# Patient Record
Sex: Male | Born: 1975 | Hispanic: Yes | Marital: Single | Smoking: Never smoker
Health system: Southern US, Community
[De-identification: ages and names within clinical notes are randomized; demographics above are authoritative.]

## PROBLEM LIST (undated history)

## (undated) DIAGNOSIS — E119 Type 2 diabetes mellitus without complications: Secondary | ICD-10-CM

## (undated) HISTORY — PX: NO PAST SURGERIES: SHX2092

---

## 2011-12-01 ENCOUNTER — Emergency Department: Payer: Self-pay | Admitting: Unknown Physician Specialty

## 2013-06-01 ENCOUNTER — Emergency Department: Payer: Self-pay | Admitting: Emergency Medicine

## 2013-06-01 LAB — COMPREHENSIVE METABOLIC PANEL
ANION GAP: 7 (ref 7–16)
AST: 38 U/L — AB (ref 15–37)
Albumin: 3.9 g/dL (ref 3.4–5.0)
Alkaline Phosphatase: 85 U/L
BUN: 19 mg/dL — ABNORMAL HIGH (ref 7–18)
Bilirubin,Total: 0.4 mg/dL (ref 0.2–1.0)
CALCIUM: 9 mg/dL (ref 8.5–10.1)
Chloride: 102 mmol/L (ref 98–107)
Co2: 26 mmol/L (ref 21–32)
Creatinine: 0.83 mg/dL (ref 0.60–1.30)
EGFR (Non-African Amer.): >60
GLUCOSE: 111 mg/dL — AB (ref 65–99)
OSMOLALITY: 273 (ref 275–301)
Potassium: 3.6 mmol/L (ref 3.5–5.1)
SGPT (ALT): 57 U/L (ref 12–78)
SODIUM: 135 mmol/L — AB (ref 136–145)
Total Protein: 7.7 g/dL (ref 6.4–8.2)

## 2013-06-01 LAB — URINALYSIS, COMPLETE
BACTERIA: NONE SEEN
Bilirubin,UR: NEGATIVE
Blood: NEGATIVE
GLUCOSE, UR: NEGATIVE mg/dL (ref 0–75)
Ketone: NEGATIVE
LEUKOCYTE ESTERASE: NEGATIVE
Nitrite: NEGATIVE
Ph: 6 (ref 4.5–8.0)
Protein: NEGATIVE
RBC,UR: NONE SEEN /HPF (ref 0–5)
SPECIFIC GRAVITY: 1.011 (ref 1.003–1.030)
Squamous Epithelial: NONE SEEN
WBC UR: NONE SEEN /HPF (ref 0–5)

## 2013-06-01 LAB — TROPONIN I
TROPONIN-I: 0.04 ng/mL
TROPONIN-I: 0.04 ng/mL

## 2013-06-01 LAB — CBC WITH DIFFERENTIAL/PLATELET
BASOS ABS: 0 10*3/uL (ref 0.0–0.1)
BASOS PCT: 0.5 %
EOS PCT: 2.5 %
Eosinophil #: 0.2 10*3/uL (ref 0.0–0.7)
HCT: 47 % (ref 40.0–52.0)
HGB: 15.9 g/dL (ref 13.0–18.0)
LYMPHS ABS: 4.1 10*3/uL — AB (ref 1.0–3.6)
Lymphocyte %: 43.4 %
MCH: 30 pg (ref 26.0–34.0)
MCHC: 33.8 g/dL (ref 32.0–36.0)
MCV: 89 fL (ref 80–100)
MONO ABS: 0.5 x10 3/mm (ref 0.2–1.0)
Monocyte %: 5.8 %
NEUTROS ABS: 4.5 10*3/uL (ref 1.4–6.5)
NEUTROS PCT: 47.8 %
Platelet: 207 10*3/uL (ref 150–440)
RBC: 5.29 10*6/uL (ref 4.40–5.90)
RDW: 13.7 % (ref 11.5–14.5)
WBC: 9.4 10*3/uL (ref 3.8–10.6)
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
## 2016-11-15 ENCOUNTER — Emergency Department: Payer: Self-pay

## 2016-11-15 ENCOUNTER — Encounter: Payer: Self-pay | Admitting: Emergency Medicine

## 2016-11-15 ENCOUNTER — Emergency Department
Admission: EM | Admit: 2016-11-15 | Discharge: 2016-11-16 | Disposition: A | Discharge status: Home / Self Care | Payer: Self-pay | Attending: Emergency Medicine | Admitting: Emergency Medicine

## 2016-11-15 DIAGNOSIS — S0990XA Unspecified injury of head, initial encounter: Secondary | ICD-10-CM | POA: Insufficient documentation

## 2016-11-15 DIAGNOSIS — Y999 Unspecified external cause status: Secondary | ICD-10-CM | POA: Insufficient documentation

## 2016-11-15 DIAGNOSIS — Y939 Activity, unspecified: Secondary | ICD-10-CM | POA: Insufficient documentation

## 2016-11-15 DIAGNOSIS — E119 Type 2 diabetes mellitus without complications: Secondary | ICD-10-CM | POA: Insufficient documentation

## 2016-11-15 DIAGNOSIS — S022XXA Fracture of nasal bones, initial encounter for closed fracture: Secondary | ICD-10-CM | POA: Insufficient documentation

## 2016-11-15 DIAGNOSIS — Y929 Unspecified place or not applicable: Secondary | ICD-10-CM | POA: Insufficient documentation

## 2016-11-15 HISTORY — DX: Type 2 diabetes mellitus without complications: E11.9
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
## 2016-11-16 MED ORDER — AMOXICILLIN-POT CLAVULANATE 875-125 MG PO TABS: 1.0000 | ORAL_TABLET | Freq: Two times a day (BID) | ORAL | 0 refills | Status: AC

## 2016-11-16 MED ORDER — OXYCODONE-ACETAMINOPHEN 5-325 MG PO TABS
2.0000 | ORAL_TABLET | Freq: Once | ORAL | Status: AC
  Administered 2016-11-16: 2 via ORAL
  Filled 2016-11-16: qty 2

## 2016-11-16 MED ORDER — AMOXICILLIN-POT CLAVULANATE 875-125 MG PO TABS
1.0000 | ORAL_TABLET | Freq: Once | ORAL | Status: AC
  Administered 2016-11-16: 1 via ORAL
  Filled 2016-11-16: qty 1

## 2016-11-16 MED ORDER — DOCUSATE SODIUM 100 MG PO CAPS: ORAL_CAPSULE | ORAL | 0 refills | Status: AC

## 2016-11-16 MED ORDER — HYDROCODONE-ACETAMINOPHEN 5-325 MG PO TABS: 1.0000 | ORAL_TABLET | Freq: Four times a day (QID) | ORAL | 0 refills | Status: AC | PRN
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
## 2016-11-16 NOTE — ED Notes (Signed)
Pt signed paper copy of discharge 

## 2016-11-24 ENCOUNTER — Encounter: Payer: Self-pay | Admitting: *Deleted

## 2016-11-24 NOTE — Discharge Instructions (Signed)
General Anesthesia, Adult, Care After °These instructions provide you with information about caring for yourself after your procedure. Your health care provider may also give you more specific instructions. Your treatment has been planned according to current medical practices, but problems sometimes occur. Call your health care provider if you have any problems or questions after your procedure. °What can I expect after the procedure? °After the procedure, it is common to have: °· Vomiting. °· A sore throat. °· Mental slowness. ° °It is common to feel: °· Nauseous. °· Cold or shivery. °· Sleepy. °· Tired. °· Sore or achy, even in parts of your body where you did not have surgery. ° °Follow these instructions at home: °For at least 24 hours after the procedure: °· Do not: °? Participate in activities where you could fall or become injured. °? Drive. °? Use heavy machinery. °? Drink alcohol. °? Take sleeping pills or medicines that cause drowsiness. °? Make important decisions or sign legal documents. °? Take care of children on your own. °· Rest. °Eating and drinking °· If you vomit, drink water, juice, or soup when you can drink without vomiting. °· Drink enough fluid to keep your urine clear or pale yellow. °· Make sure you have little or no nausea before eating solid foods. °· Follow the diet recommended by your health care provider. °General instructions °· Have a responsible adult stay with you until you are awake and alert. °· Return to your normal activities as told by your health care provider. Ask your health care provider what activities are safe for you. °· Take over-the-counter and prescription medicines only as told by your health care provider. °· If you smoke, do not smoke without supervision. °· Keep all follow-up visits as told by your health care provider. This is important. °Contact a health care provider if: °· You continue to have nausea or vomiting at home, and medicines are not helpful. °· You  cannot drink fluids or start eating again. °· You cannot urinate after 8-12 hours. °· You develop a skin rash. °· You have fever. °· You have increasing redness at the site of your procedure. °Get help right away if: °· You have difficulty breathing. °· You have chest pain. °· You have unexpected bleeding. °· You feel that you are having a life-threatening or urgent problem. °This information is not intended to replace advice given to you by your health care provider. Make sure you discuss any questions you have with your health care provider. °Document Released: 05/12/2000 Document Revised: 07/09/2015 Document Reviewed: 01/18/2015 °Elsevier Interactive Patient Education © 2018 [employer] ° °

## 2016-11-27 ENCOUNTER — Ambulatory Visit: Payer: Self-pay | Admitting: Anesthesiology

## 2016-11-27 ENCOUNTER — Encounter
Admission: RE | Disposition: A | Discharge status: Home / Self Care | Payer: Self-pay | Source: Ambulatory Visit | Attending: Otolaryngology

## 2016-11-27 ENCOUNTER — Ambulatory Visit
Admission: RE | Admit: 2016-11-27 | Discharge: 2016-11-27 | Disposition: A | Discharge status: Home / Self Care | Payer: Self-pay | Source: Ambulatory Visit | Attending: Otolaryngology | Admitting: Otolaryngology

## 2016-11-27 DIAGNOSIS — E119 Type 2 diabetes mellitus without complications: Secondary | ICD-10-CM | POA: Insufficient documentation

## 2016-11-27 DIAGNOSIS — Z7984 Long term (current) use of oral hypoglycemic drugs: Secondary | ICD-10-CM | POA: Insufficient documentation

## 2016-11-27 DIAGNOSIS — Y929 Unspecified place or not applicable: Secondary | ICD-10-CM | POA: Insufficient documentation

## 2016-11-27 DIAGNOSIS — Z79899 Other long term (current) drug therapy: Secondary | ICD-10-CM | POA: Insufficient documentation

## 2016-11-27 DIAGNOSIS — S022XXA Fracture of nasal bones, initial encounter for closed fracture: Secondary | ICD-10-CM | POA: Insufficient documentation

## 2016-11-27 HISTORY — PX: CLOSED REDUCTION NASAL FRACTURE: SHX5365

## 2016-11-27 LAB — GLUCOSE, CAPILLARY
Glucose-Capillary: 173 mg/dL — ABNORMAL HIGH (ref 65–99)
Glucose-Capillary: 234 mg/dL — ABNORMAL HIGH (ref 65–99)

## 2016-11-27 SURGERY — CLOSED REDUCTION, FRACTURE, NASAL BONE
Anesthesia: General | Site: Nose | Wound class: Clean Contaminated

## 2016-11-27 MED ORDER — FENTANYL CITRATE (PF) 100 MCG/2ML IJ SOLN
INTRAMUSCULAR | Status: DC | PRN
  Administered 2016-11-27: 100 ug via INTRAVENOUS

## 2016-11-27 MED ORDER — MEPERIDINE HCL 25 MG/ML IJ SOLN: 6.2500 mg | INTRAMUSCULAR | Status: DC | PRN

## 2016-11-27 MED ORDER — DEXAMETHASONE SODIUM PHOSPHATE 4 MG/ML IJ SOLN
INTRAMUSCULAR | Status: DC | PRN
  Administered 2016-11-27: 8 mg via INTRAVENOUS

## 2016-11-27 MED ORDER — MIDAZOLAM HCL 5 MG/5ML IJ SOLN
INTRAMUSCULAR | Status: DC | PRN
  Administered 2016-11-27: 2 mg via INTRAVENOUS

## 2016-11-27 MED ORDER — ONDANSETRON HCL 4 MG/2ML IJ SOLN
INTRAMUSCULAR | Status: DC | PRN
  Administered 2016-11-27: 4 mg via INTRAVENOUS

## 2016-11-27 MED ORDER — LIDOCAINE HCL (CARDIAC) 20 MG/ML IV SOLN
INTRAVENOUS | Status: DC | PRN
  Administered 2016-11-27: 50 mg via INTRATRACHEAL

## 2016-11-27 MED ORDER — PROMETHAZINE HCL 25 MG/ML IJ SOLN: 6.2500 mg | INTRAMUSCULAR | Status: DC | PRN

## 2016-11-27 MED ORDER — LACTATED RINGERS IV SOLN
10.0000 mL/h | INTRAVENOUS | Status: DC
  Administered 2016-11-27: 10 mL/h via INTRAVENOUS

## 2016-11-27 MED ORDER — OXYCODONE HCL 5 MG PO TABS
5.0000 mg | ORAL_TABLET | Freq: Once | ORAL | Status: AC | PRN
  Administered 2016-11-27: 5 mg via ORAL

## 2016-11-27 MED ORDER — OXYCODONE HCL 5 MG/5ML PO SOLN: 5.0000 mg | Freq: Once | ORAL | Status: AC | PRN

## 2016-11-27 MED ORDER — PROPOFOL 10 MG/ML IV BOLUS
INTRAVENOUS | Status: DC | PRN
  Administered 2016-11-27: 200 mg via INTRAVENOUS

## 2016-11-27 MED ORDER — PHENYLEPHRINE HCL 0.5 % NA SOLN
NASAL | Status: DC | PRN
  Administered 2016-11-27: 5 mL via TOPICAL

## 2016-11-27 MED ORDER — FENTANYL CITRATE (PF) 100 MCG/2ML IJ SOLN: 25.0000 ug | INTRAMUSCULAR | Status: DC | PRN

## 2016-11-27 MED ORDER — OXYMETAZOLINE HCL 0.05 % NA SOLN
2.0000 | Freq: Once | NASAL | Status: AC
  Administered 2016-11-27: 2 via NASAL

## 2016-11-27 MED ORDER — GLYCOPYRROLATE 0.2 MG/ML IJ SOLN
INTRAMUSCULAR | Status: DC | PRN
Start: 2016-11-27 — End: 2016-11-27
  Administered 2016-11-27: 0.1 mg via INTRAVENOUS

## 2016-11-27 SURGICAL SUPPLY — 18 items
ADHESIVE MASTISOL STRL (MISCELLANEOUS) IMPLANT
BASIN GRAD PLASTIC 32OZ STRL (MISCELLANEOUS) ×3 IMPLANT
CANISTER SUCT 1200ML W/VALVE (MISCELLANEOUS) ×3 IMPLANT
CLOSURE WOUND 1/2 X4 (GAUZE/BANDAGES/DRESSINGS) ×1
COVER MAYO STAND STRL (DRAPES) ×3 IMPLANT
CUP MEDICINE 2OZ PLAST GRAD ST (MISCELLANEOUS) ×3 IMPLANT
GAUZE PETROLATUM 1 X8 (GAUZE/BANDAGES/DRESSINGS) IMPLANT
GAUZE SPONGE 4X4 12PLY STRL (GAUZE/BANDAGES/DRESSINGS) ×3 IMPLANT
GLOVE PI ULTRA LF STRL 7.5 (GLOVE) ×1 IMPLANT
GLOVE PI ULTRA NON LATEX 7.5 (GLOVE) ×2
KIT ROOM TURNOVER OR (KITS) ×3 IMPLANT
PAD ALCOHOL SWAB (MISCELLANEOUS) ×3 IMPLANT
SPONGE NEURO XRAY DETECT 1X3 (DISPOSABLE) ×3 IMPLANT
STRAP BODY AND KNEE 60X3 (MISCELLANEOUS) ×3 IMPLANT
STRIP CLOSURE SKIN 1/2X4 (GAUZE/BANDAGES/DRESSINGS) ×2 IMPLANT
TOWEL OR 17X26 4PK STRL BLUE (TOWEL DISPOSABLE) ×3 IMPLANT
TUBING CONN 6MMX3.1M (TUBING) ×2
TUBING SUCTION CONN 0.25 STRL (TUBING) ×1 IMPLANT

## 2016-11-27 NOTE — Anesthesia Preprocedure Evaluation (Signed)
Anesthesia Evaluation  Patient identified by MRN, date of birth, ID band Patient awake    Reviewed: Allergy & Precautions, H&P , NPO status , Patient's Chart, lab work & pertinent test results, reviewed documented beta blocker date and time   Airway Mallampati: II  TM Distance: >3 FB Neck ROM: full    Dental no notable dental hx.    Pulmonary neg pulmonary ROS,    Pulmonary exam normal breath sounds clear to auscultation       Cardiovascular Exercise Tolerance: Good negative cardio ROS   Rhythm:regular Rate:Normal     Neuro/Psych negative neurological ROS  negative psych ROS   GI/Hepatic negative GI ROS, Neg liver ROS,   Endo/Other  diabetes, Well Controlled  Renal/GU negative Renal ROS  negative genitourinary   Musculoskeletal   Abdominal   Peds  Hematology negative hematology ROS (+)   Anesthesia Other Findings   Reproductive/Obstetrics negative OB ROS                             Anesthesia Physical Anesthesia Plan  ASA: II  Anesthesia Plan: General   Post-op Pain Management:    Induction:   PONV Risk Score and Plan:   Airway Management Planned:   Additional Equipment:   Intra-op Plan:   Post-operative Plan:   Informed Consent: I have reviewed the patients History and Physical, chart, labs and discussed the procedure including the risks, benefits and alternatives for the proposed anesthesia with the patient or authorized representative who has indicated his/her understanding and acceptance.   Dental Advisory Given  Plan Discussed with: CRNA  Anesthesia Plan Comments:         Anesthesia Quick Evaluation

## 2016-11-27 NOTE — Transfer of Care (Signed)
Immediate Anesthesia Transfer of Care Note  Patient: Thomas Choi [facility]  Procedure(s) Performed: CLOSED REDUCTION NASAL FRACTURE (N/A Nose)  Patient Location: PACU  Anesthesia Type: General  Level of Consciousness: awake, alert  and patient cooperative  Airway and Oxygen Therapy: Patient Spontanous Breathing and Patient connected to supplemental oxygen  Post-op Assessment: Post-op Vital signs reviewed, Patient's Cardiovascular Status Stable, Respiratory Function Stable, Patent Airway and No signs of Nausea or vomiting  Post-op Vital Signs: Reviewed and stable  Complications: No apparent anesthesia complications

## 2016-11-27 NOTE — Anesthesia Procedure Notes (Signed)
Procedure Name: LMA Insertion Date/Time: 11/27/2016 11:48 AM Performed by: Andee Poles Pre-anesthesia Checklist: Patient identified, Emergency Drugs available, Suction available, Timeout performed and Patient being monitored Patient Re-evaluated:Patient Re-evaluated prior to induction Oxygen Delivery Method: Circle system utilized Preoxygenation: Pre-oxygenation with 100% oxygen Induction Type: IV induction LMA: LMA inserted LMA Size: 4.0 Number of attempts: 1 Placement Confirmation: positive ETCO2 and breath sounds checked- equal and bilateral Tube secured with: Tape

## 2016-11-27 NOTE — Op Note (Signed)
11/27/2016  10:04 AM    Isabelle Course, Elita Quick  [id number]   Pre-Op Dx:  Displaced nasal fracture  Post-op Dx: Displaced nasal fracture  Proc: Closed reduction nasal fracture   Surg:  Teyona Nichelson H  Anes:  GOT  EBL:  Minimal  Comp:  None  Findings:  Nasal bones markedly pushed off to his left side. The septum was not significantly displaced and is airways were still relatively open.  Procedure: The patient was given general anesthesia by oral endotracheal intubation. Once patient was asleep his nose was prepped using cotton pledgets soaked with 4% lidocaine and Afrin. These were placed on both sides the nose Mutt sit for 5 minutes for vasoconstriction. His nose prepped with alcohol area did the cotton pledges were removed. A [place] elevator was used inside the right nostril to help elevate the right nasal bone. Digital pressure was then placed on the left nasal bones to fracture them back to the midline. Once they pop back to their normal position the nose appeared to be straight. The airways were open on both sides. Steri-Strips were placed over the nasal dorsum followed by an Aquaplast cast. This was held place with tape over the nasal dorsum. The patient was then awakened and taken to the recovery room in satisfactory condition. There were no operative complications. He tolerated the procedure very well  Dispo:   To PACU to be discharged home  Plan:  To follow-up in the office in 5 days for cast removal. He'll rest at home with his head elevated today.  Kiala Faraj H  11/27/2016 10:04 AM

## 2016-11-27 NOTE — Anesthesia Postprocedure Evaluation (Signed)
Anesthesia Post Note  Patient: Thomas Choi  Procedure(s) Performed: CLOSED REDUCTION NASAL FRACTURE (N/A Nose)  Patient location during evaluation: PACU Anesthesia Type: General Level of consciousness: awake and alert Pain management: pain level controlled Vital Signs Assessment: post-procedure vital signs reviewed and stable Respiratory status: spontaneous breathing, nonlabored ventilation, respiratory function stable and patient connected to nasal cannula oxygen Cardiovascular status: blood pressure returned to baseline and stable Postop Assessment: no apparent nausea or vomiting Anesthetic complications: no    Mariko Nowakowski ELAINE

## 2016-11-27 NOTE — H&P (Signed)
H&P has been reviewedand patient reevaluated,  and no changes necessary. To be downloaded later.  

## 2016-11-28 ENCOUNTER — Encounter: Payer: Self-pay | Admitting: Otolaryngology

## 2017-03-14 ENCOUNTER — Emergency Department
Admission: EM | Admit: 2017-03-14 | Discharge: 2017-03-15 | Disposition: A | Discharge status: Home / Self Care | Payer: Self-pay | Attending: Emergency Medicine | Admitting: Emergency Medicine

## 2017-03-14 ENCOUNTER — Encounter: Payer: Self-pay | Admitting: Emergency Medicine

## 2017-03-14 ENCOUNTER — Other Ambulatory Visit: Payer: Self-pay

## 2017-03-14 DIAGNOSIS — R300 Dysuria: Secondary | ICD-10-CM | POA: Insufficient documentation

## 2017-03-14 DIAGNOSIS — E119 Type 2 diabetes mellitus without complications: Secondary | ICD-10-CM | POA: Insufficient documentation

## 2017-03-14 DIAGNOSIS — Z79899 Other long term (current) drug therapy: Secondary | ICD-10-CM | POA: Insufficient documentation

## 2017-03-14 DIAGNOSIS — R5383 Other fatigue: Secondary | ICD-10-CM | POA: Insufficient documentation

## 2017-03-14 DIAGNOSIS — N50819 Testicular pain, unspecified: Secondary | ICD-10-CM | POA: Insufficient documentation

## 2017-03-14 LAB — COMPREHENSIVE METABOLIC PANEL
ALT: 38 U/L (ref 17–63)
ANION GAP: 11 (ref 5–15)
AST: 23 U/L (ref 15–41)
Albumin: 4.5 g/dL (ref 3.5–5.0)
Alkaline Phosphatase: 110 U/L (ref 38–126)
BUN: 17 mg/dL (ref 6–20)
CO2: 25 mmol/L (ref 22–32)
Calcium: 9.4 mg/dL (ref 8.9–10.3)
Chloride: 101 mmol/L (ref 101–111)
Creatinine, Ser: 0.74 mg/dL (ref 0.61–1.24)
GFR calc non Af Amer: >60 mL/min (ref >60)
Glucose, Bld: 315 mg/dL — ABNORMAL HIGH (ref 65–99)
POTASSIUM: 3.7 mmol/L (ref 3.5–5.1)
SODIUM: 137 mmol/L (ref 135–145)
TOTAL PROTEIN: 8 g/dL (ref 6.5–8.1)
Total Bilirubin: 1 mg/dL (ref 0.3–1.2)

## 2017-03-14 LAB — CHLAMYDIA/NGC RT PCR (ARMC ONLY)
Chlamydia Tr: NOT DETECTED
N gonorrhoeae: NOT DETECTED

## 2017-03-14 LAB — URINALYSIS, COMPLETE (UACMP) WITH MICROSCOPIC
BILIRUBIN URINE: NEGATIVE
Bacteria, UA: NONE SEEN
Glucose, UA: >=500 mg/dL — AB
HGB URINE DIPSTICK: NEGATIVE
KETONES UR: 5 mg/dL — AB
LEUKOCYTES UA: NEGATIVE
NITRITE: NEGATIVE
PROTEIN: NEGATIVE mg/dL
SPECIFIC GRAVITY, URINE: 1.026 (ref 1.005–1.030)
Squamous Epithelial / LPF: NONE SEEN
pH: 7 (ref 5.0–8.0)

## 2017-03-14 LAB — CBC
HEMATOCRIT: 45.2 % (ref 40.0–52.0)
HEMOGLOBIN: 15.6 g/dL (ref 13.0–18.0)
MCH: 30.5 pg (ref 26.0–34.0)
MCHC: 34.5 g/dL (ref 32.0–36.0)
MCV: 88.4 fL (ref 80.0–100.0)
Platelets: 234 10*3/uL (ref 150–440)
RBC: 5.12 MIL/uL (ref 4.40–5.90)
RDW: 13.3 % (ref 11.5–14.5)
WBC: 10.7 10*3/uL — ABNORMAL HIGH (ref 3.8–10.6)

## 2017-03-14 NOTE — ED Notes (Signed)
Pt questioned about anxiety. 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 Pt states he is also taking oxycodone for pain for a month as well.

## 2017-03-14 NOTE — ED Provider Notes (Signed)
[facility] Emergency Department Provider Note  ____________________________________________  Time seen: Approximately 11:14 PM  I have reviewed the triage vital signs and the nursing notes.   HISTORY  Chief Complaint Abdominal Pain  Encounter conducted with Spanish interpreter via video  HPI Thomas Choi is a 42 y.o. male who complains of left lower quadrant abdominal pain radiating to his testicle since 9 AM yesterday. No nausea vomiting or diarrhea. No fevers chills or sweats. He does endorse some dysuria but no hematuria or urinary frequency or urgency. Pain is intermittent, sharp, mild to moderate intensity. No aggravating or alleviating factors.  Patient also reports he has 4 kids age 4, 78, 38, 60. He feels like taking care of them has made him very tired and he reports his biggest concern is that he would like some rest. He has recently been taking oxycodone as well as cocaine.  Patient denies being sexually active   Past Medical History:  Diagnosis Date  . Diabetes mellitus without complication (HCC)      There are no active problems to display for this patient.    Past Surgical History:  Procedure Laterality Date  . CLOSED REDUCTION NASAL FRACTURE N/A 11/27/2016   Procedure: CLOSED REDUCTION NASAL FRACTURE;  Surgeon: Vernie Murders, MD;  Location: [facility];  Service: ENT;  Laterality: N/A;  Diabetic - oral meds  . NO PAST SURGERIES       Prior to Admission medications   Medication Sig Start Date End Date Taking? Authorizing Provider  docusate sodium (COLACE) 100 MG capsule Take 1 tablet once or twice daily as needed for constipation while taking narcotic pain medicine 11/16/16   Loleta Rose, MD  glipiZIDE (GLUCOTROL) 10 MG tablet Take 10 mg by mouth 2 (two) times daily before a meal.    [provider]  HYDROcodone-acetaminophen (NORCO/VICODIN) 5-325 MG tablet Take 1-2 tablets by mouth every 6 (six) hours as  needed for moderate pain. 11/16/16   Loleta Rose, MD  linagliptin (TRADJENTA) 5 MG TABS tablet Take 5 mg by mouth daily.    [provider]  metFORMIN (GLUCOPHAGE) 500 MG tablet Take 500 mg by mouth 2 (two) times daily with a meal.    [provider]     Allergies Patient has no known allergies.   History reviewed. No pertinent family history.  Social History Social History   Tobacco Use  . Smoking status: Never Smoker  . Smokeless tobacco: Never Used  Substance Use Topics  . Alcohol use: Yes    Comment: occasional  . Drug use: No    Review of Systems  Constitutional:   No fever or chills.  ENT:   No sore throat. No rhinorrhea. Cardiovascular:   No chest pain or syncope. Respiratory:   No dyspnea or cough. Gastrointestinal:   Positive as above for left lower quadrant abdominal pain without vomiting and diarrhea.  Musculoskeletal:   Negative for focal pain or swelling All other systems reviewed and are negative except as documented above in ROS and HPI.  ____________________________________________   PHYSICAL EXAM:  VITAL SIGNS: ED Triage Vitals  Enc Vitals Group     BP 03/14/17 1913 127/75     Pulse Rate 03/14/17 1913 81     Resp 03/14/17 1913 16     Temp 03/14/17 1913 98.7 F (37.1 C)     Temp Source 03/14/17 1913 Oral     SpO2 03/14/17 1913 97 %     Weight 03/14/17 1914 190  lb (86.2 kg)     Height 03/14/17 1914 5\' 7"  (1.702 m)     Head Circumference --      Peak Flow --      Pain Score 03/14/17 1913 5     Pain Loc --      Pain Edu? --      Excl. in GC? --     Vital signs reviewed, nursing assessments reviewed.   Constitutional:   Alert and oriented. Well appearing and in no distress. Eyes:   No scleral icterus.  EOMI. No nystagmus. No conjunctival pallor. PERRL. ENT   Head:   Normocephalic and atraumatic.   Nose:   No congestion/rhinnorhea.    Mouth/Throat:   MMM, no pharyngeal erythema. No peritonsillar mass.     Neck:   No meningismus. Full ROM. Hematological/Lymphatic/Immunilogical:   No cervical lymphadenopathy. Cardiovascular:   RRR. Symmetric bilateral radial and DP pulses.  No murmurs.  Respiratory:   Normal respiratory effort without tachypnea/retractions. Breath sounds are clear and equal bilaterally. No wheezes/rales/rhonchi. Gastrointestinal:   Soft and nontender. Non distended. There is no CVA tenderness.  No rebound, rigidity, or guarding. Genitourinary:   Normal, uncircumcised. No discharge or lesions or lymphadenopathy. Musculoskeletal:   Normal range of motion in all extremities. No joint effusions.  No lower extremity tenderness.  No edema. Neurologic:   Normal speech and language.  Motor grossly intact. No acute focal neurologic deficits are appreciated.  Skin:    Skin is warm, dry and intact. No rash noted.  No petechiae, purpura, or bullae.  ____________________________________________    LABS (pertinent positives/negatives) (all labs ordered are listed, but only abnormal results are displayed) Labs Reviewed  COMPREHENSIVE METABOLIC PANEL - Abnormal; Notable for the following components:      Result Value   Glucose, Bld 315 (*)    All other components within normal limits  CBC - Abnormal; Notable for the following components:   WBC 10.7 (*)    All other components within normal limits  URINALYSIS, COMPLETE (UACMP) WITH MICROSCOPIC - Abnormal; Notable for the following components:   Color, Urine YELLOW (*)    APPearance CLEAR (*)    Glucose, UA >=500 (*)    Ketones, ur 5 (*)    All other components within normal limits  CHLAMYDIA/NGC RT PCR ([facility] ONLY)   ____________________________________________   EKG    ____________________________________________    RADIOLOGY  No results found.  ____________________________________________   PROCEDURES Procedures  ____________________________________________    CLINICAL IMPRESSION / ASSESSMENT AND PLAN  / ED COURSE  Pertinent labs & imaging results that were available during my care of the patient were reviewed by me and considered in my medical decision making (see chart for details).     Clinical Course as of Mar 14 2312  Sat Mar 14, 2017  2204 Patient presents with abdominal and genital complaints, reassuring exam. However, he summarizes by saying through the interpreter "really I just need to sleep. I'm very tired because of my kids, any to be taken care of so I can rest."  I'll check a GC chlamydia PCR. Chemistry, CBC unremarkable. Urinalysis unremarkable without signs of infection.  [PS]    Clinical Course User Index [PS] Sharman Cheek, MD   Symptoms appear to be related to anxiety, polysubstance abuse.  ____________________________________________   FINAL CLINICAL IMPRESSION(S) / ED DIAGNOSES    Final diagnoses:  Fatigue, unspecified type  Testicle pain       Portions of this note were generated with  Scientist, clinical (histocompatibility and immunogenetics)dragon dictation software. Dictation errors may occur despite best attempts at proofreading.    Sharman CheekStafford, Jaylinn Hellenbrand, MD 03/14/17 2324

## 2017-03-14 NOTE — ED Notes (Addendum)
Pt waiting lab results prior to d/c. Family at bedside

## 2017-03-14 NOTE — ED Notes (Signed)

## 2017-03-14 NOTE — ED Notes (Signed)
ED Provider at bedside. 

## 2017-03-14 NOTE — ED Notes (Signed)
Family at bedside. 

## 2017-03-14 NOTE — ED Triage Notes (Signed)
Pt states left lower quadrant pain since 0900 yesterday. Pt denies vomiting, nausea, diarrhea or known fever. Pt denies known hematuria. Pt very anxious in triage.

## 2017-03-14 NOTE — ED Notes (Signed)
iPad interpreter brought into room.

## 2020-08-23 ENCOUNTER — Encounter: Payer: Self-pay | Admitting: Emergency Medicine

## 2020-08-23 DIAGNOSIS — Z20822 Contact with and (suspected) exposure to covid-19: Secondary | ICD-10-CM | POA: Diagnosis not present

## 2020-08-23 DIAGNOSIS — E119 Type 2 diabetes mellitus without complications: Secondary | ICD-10-CM | POA: Diagnosis not present

## 2020-08-23 DIAGNOSIS — Z7984 Long term (current) use of oral hypoglycemic drugs: Secondary | ICD-10-CM | POA: Insufficient documentation

## 2020-08-23 DIAGNOSIS — B349 Viral infection, unspecified: Secondary | ICD-10-CM | POA: Diagnosis not present

## 2020-08-23 DIAGNOSIS — R509 Fever, unspecified: Secondary | ICD-10-CM | POA: Diagnosis present

## 2020-08-23 LAB — RESP PANEL BY RT-PCR (FLU A&B, COVID) ARPGX2
Influenza A by PCR: NEGATIVE
Influenza B by PCR: NEGATIVE
SARS Coronavirus 2 by RT PCR: NEGATIVE

## 2020-08-23 MED ORDER — ACETAMINOPHEN 500 MG PO TABS
1000.0000 mg | ORAL_TABLET | Freq: Once | ORAL | Status: AC
  Administered 2020-08-23: 1000 mg via ORAL
  Filled 2020-08-23: qty 2

## 2020-08-23 NOTE — ED Triage Notes (Signed)
Pt c/o fever, chills and HA x2 days. No tylenol or ibuprofen taken today. Pt denies cough/congestion or SOB.

## 2020-08-24 ENCOUNTER — Emergency Department
Admission: EM | Admit: 2020-08-24 | Discharge: 2020-08-24 | Disposition: A | Discharge status: Home / Self Care | Payer: BC Managed Care – PPO | Attending: Emergency Medicine | Admitting: Emergency Medicine

## 2020-08-24 DIAGNOSIS — B349 Viral infection, unspecified: Secondary | ICD-10-CM

## 2020-08-24 LAB — URINALYSIS, COMPLETE (UACMP) WITH MICROSCOPIC
Bacteria, UA: NONE SEEN
Bilirubin Urine: NEGATIVE
Glucose, UA: >=500 mg/dL — AB
Hgb urine dipstick: NEGATIVE
Ketones, ur: NEGATIVE mg/dL
Leukocytes,Ua: NEGATIVE
Nitrite: NEGATIVE
Protein, ur: NEGATIVE mg/dL
Specific Gravity, Urine: 1.026 (ref 1.005–1.030)
pH: 6 (ref 5.0–8.0)

## 2020-08-24 NOTE — ED Provider Notes (Signed)
[facility] Emergency Department Provider Note  Time seen: 1:37 AM  I have reviewed the triage vital signs and the nursing notes.   HISTORY  Chief Complaint Fever and Headache   HPI Thomas Choi is a 45 y.o. male with a past medical history of diabetes who presents to the emergency department for fever.  According to the patient over the past 2 to 3 days he has been experiencing a fever, headache at times, body aches at times and feels like his taste is off.  Patient states he was around someone 4 days ago who is coughing and he thinks he could have caught what ever they had.  Patient denies any chest pain or abdominal pain.  Patient states he received Tylenol when he first got here now has no symptoms whatsoever and feels well.   Past Medical History:  Diagnosis Date   Diabetes mellitus without complication (HCC)     There are no problems to display for this patient.   Past Surgical History:  Procedure Laterality Date   CLOSED REDUCTION NASAL FRACTURE N/A 11/27/2016   Procedure: CLOSED REDUCTION NASAL FRACTURE;  Surgeon: Vernie Murders, MD;  Location: [facility];  Service: ENT;  Laterality: N/A;  Diabetic - oral meds   NO PAST SURGERIES      Prior to Admission medications   Medication Sig Start Date End Date Taking? Authorizing Provider  docusate sodium (COLACE) 100 MG capsule Take 1 tablet once or twice daily as needed for constipation while taking narcotic pain medicine 11/16/16   Loleta Rose, MD  glipiZIDE (GLUCOTROL) 10 MG tablet Take 10 mg by mouth 2 (two) times daily before a meal.    [provider]  HYDROcodone-acetaminophen (NORCO/VICODIN) 5-325 MG tablet Take 1-2 tablets by mouth every 6 (six) hours as needed for moderate pain. 11/16/16   Loleta Rose, MD  linagliptin (TRADJENTA) 5 MG TABS tablet Take 5 mg by mouth daily.    [provider]  metFORMIN (GLUCOPHAGE) 500 MG tablet Take 500 mg by mouth 2 (two) times  daily with a meal.    [provider]    No Known Allergies  History reviewed. No pertinent family history.  Social History Social History   Tobacco Use   Smoking status: Never   Smokeless tobacco: Never  Vaping Use   Vaping Use: Never used  Substance Use Topics   Alcohol use: Yes    Comment: occasional   Drug use: No    Review of Systems Constitutional: Positive for fever x2 days.  Positive for intermittent dizziness.  States changes in taste Cardiovascular: Negative for chest pain. Respiratory: Negative for shortness of breath. Gastrointestinal: Negative for abdominal pain, vomiting and diarrhea. Genitourinary: States when he urinates it feels hot recently. Musculoskeletal: Positive for body aches Neurological: Mild headache, now resolved All other ROS negative  ____________________________________________   PHYSICAL EXAM:  VITAL SIGNS: ED Triage Vitals  Enc Vitals Group     BP 08/23/20 2132 113/73     Pulse Rate 08/23/20 2132 (!) 106     Resp 08/23/20 2132 20     Temp 08/23/20 2132 (!) 101.7 F (38.7 C)     Temp Source 08/23/20 2132 Oral     SpO2 08/23/20 2132 98 %     Weight 08/23/20 2130 174 lb (78.9 kg)     Height 08/23/20 2130 5\' 7"  (1.702 m)     Head Circumference --      Peak Flow --  Pain Score --      Pain Loc --      Pain Edu? --      Excl. in GC? --     Constitutional: Alert and oriented. Well appearing and in no distress. Eyes: Normal exam ENT      Head: Normocephalic and atraumatic.      Mouth/Throat: Mucous membranes are moist. Cardiovascular: Normal rate, regular rhythm.  Respiratory: Normal respiratory effort without tachypnea nor retractions. Breath sounds are clear  Gastrointestinal: Soft and nontender. No distention.   Musculoskeletal: Nontender with normal range of motion in all extremities.  Neurologic:  Normal speech and language. No gross focal neurologic deficits Skin:  Skin is warm, dry and intact.   Psychiatric: Mood and affect are normal.   ____________________________________________    INITIAL IMPRESSION / ASSESSMENT AND PLAN / ED COURSE  Pertinent labs & imaging results that were available during my care of the patient were reviewed by me and considered in my medical decision making (see chart for details).   Patient presents emergency department for fever, body aches, weakness, and taste changes.  Symptoms are very suggestive of COVID infection however his COVID PCR test is negative.  Patient otherwise denies any other symptoms.  States all of his symptoms have resolved since receiving Tylenol, states he has not been using Tylenol or ibuprofen at home.  I discussed with the patient supportive care including fluids rest Tylenol or ibuprofen as needed every 6 hours.  I also discussed with the patient retesting for COVID in 2 or 3 days as a precaution.  Patient does state his urine feels "hot" recently but denies any dysuria.  We will check urinalysis as a precaution.  Patient agreeable plan of care.  Patient's urinalysis is negative.  Suspect likely viral syndrome.  Discussed supportive care.  Patient will be discharged home.  Thomas Choi was evaluated in Emergency Department on 08/24/2020 for the symptoms described in the history of present illness. He was evaluated in the context of the global COVID-19 pandemic, which necessitated consideration that the patient might be at risk for infection with the SARS-CoV-2 virus that causes COVID-19. Institutional protocols and algorithms that pertain to the evaluation of patients at risk for COVID-19 are in a state of rapid change based on information released by regulatory bodies including the CDC and federal and state organizations. These policies and algorithms were followed during the patient's care in the ED.  ____________________________________________   FINAL CLINICAL IMPRESSION(S) / ED DIAGNOSES  Viral illness   [name], MD 08/24/20 0151

## 2020-08-24 NOTE — Discharge Instructions (Addendum)
As we discussed please use Tylenol or ibuprofen every 6 hours as written on the box.  Please drink plenty of fluids and obtain plenty of rest.  Please isolate yourself while you have a fever.  As we discussed your symptoms are very suggestive of COVID.  COVID test is currently negative it may be beneficial to recheck your self with a home COVID test in 2 to 3 days.  Return to the emergency department for any symptom personally concerning to yourself.
